# Patient Record
Sex: Male | Born: 1945 | Race: White | Hispanic: No | Marital: Married | Smoking: Former smoker
Health system: Southern US, Community
[De-identification: ages and names within clinical notes are randomized; demographics above are authoritative.]

## PROBLEM LIST (undated history)

## (undated) DIAGNOSIS — E039 Hypothyroidism, unspecified: Secondary | ICD-10-CM

## (undated) DIAGNOSIS — N529 Male erectile dysfunction, unspecified: Secondary | ICD-10-CM

## (undated) DIAGNOSIS — E291 Testicular hypofunction: Secondary | ICD-10-CM

## (undated) DIAGNOSIS — F329 Major depressive disorder, single episode, unspecified: Secondary | ICD-10-CM

## (undated) DIAGNOSIS — K219 Gastro-esophageal reflux disease without esophagitis: Secondary | ICD-10-CM

## (undated) HISTORY — DX: Testicular hypofunction: E29.1

## (undated) HISTORY — DX: Gastro-esophageal reflux disease without esophagitis: K21.9

## (undated) HISTORY — DX: Major depressive disorder, single episode, unspecified: F32.9

## (undated) HISTORY — DX: Hypothyroidism, unspecified: E03.9

## (undated) HISTORY — DX: Male erectile dysfunction, unspecified: N52.9

---

## 2001-01-09 ENCOUNTER — Encounter (INDEPENDENT_AMBULATORY_CARE_PROVIDER_SITE_OTHER): Payer: Self-pay | Admitting: Specialist

## 2001-01-09 ENCOUNTER — Encounter: Payer: Self-pay | Admitting: Family Medicine

## 2001-01-09 ENCOUNTER — Other Ambulatory Visit: Admission: RE | Admit: 2001-01-09 | Discharge: 2001-01-09 | Payer: Self-pay | Admitting: Gastroenterology

## 2004-07-09 ENCOUNTER — Emergency Department (HOSPITAL_COMMUNITY): Admission: EM | Admit: 2004-07-09 | Discharge: 2004-07-09 | Payer: Self-pay | Admitting: Emergency Medicine

## 2005-07-03 ENCOUNTER — Ambulatory Visit: Payer: Self-pay | Admitting: Family Medicine

## 2005-08-03 ENCOUNTER — Encounter: Admission: RE | Admit: 2005-08-03 | Discharge: 2005-08-03 | Payer: Self-pay | Admitting: Surgery

## 2005-09-18 ENCOUNTER — Ambulatory Visit: Payer: Self-pay | Admitting: Family Medicine

## 2005-09-25 ENCOUNTER — Ambulatory Visit: Payer: Self-pay | Admitting: Family Medicine

## 2007-01-09 ENCOUNTER — Ambulatory Visit: Payer: Self-pay | Admitting: Family Medicine

## 2007-01-09 LAB — CONVERTED CEMR LAB
Alkaline Phosphatase: 54 units/L (ref 39–117)
Basophils Relative: 0.7 % (ref 0.0–1.0)
Bilirubin, Direct: 0.1 mg/dL (ref 0.0–0.3)
Creatinine, Ser: 0.9 mg/dL (ref 0.4–1.5)
Eosinophils Relative: 3.3 % (ref 0.0–5.0)
GFR calc Af Amer: 110 mL/min
Glucose, Bld: 100 mg/dL — ABNORMAL HIGH (ref 70–99)
HDL: 52.4 mg/dL (ref >39.0)
Hgb A1c MFr Bld: 5.3 % (ref 4.6–6.0)
Lymphocytes Relative: 24 % (ref 12.0–46.0)
MCHC: 34.6 g/dL (ref 30.0–36.0)
MCV: 92.8 fL (ref 78.0–100.0)
Monocytes Relative: 10.8 % (ref 3.0–11.0)
PSA: 0.43 ng/mL (ref 0.10–4.00)
Platelets: 264 10*3/uL (ref 150–400)
RDW: 12.4 % (ref 11.5–14.6)
Total Bilirubin: 0.8 mg/dL (ref 0.3–1.2)
Total CHOL/HDL Ratio: 4.2
Total Protein: 7.1 g/dL (ref 6.0–8.3)
VLDL: 19 mg/dL (ref 0–40)

## 2007-01-16 ENCOUNTER — Ambulatory Visit: Payer: Self-pay | Admitting: Family Medicine

## 2007-09-01 IMAGING — CT CT NECK W/ CM
1 of 3 series · 8 of 14 positions shown, 10 images · IV contrast (75ML OMNI 300)
Comparison: none

CLINICAL DATA: Question mass or enlarged lymph node, left lower anterior neck.   Some neck soreness.
 CT NECK WITH CONTRAST:
TECHNIQUE: Multidetector CT imaging of the neck was performed following the standard protocol during administration of intravenous contrast.
 Contrast:  75 cc Omnipaque 300

[Series 2: neck w/ · axial · 0.39mm/px · z∈[-242,-32]mm · 8 of 110 slices shown, 10 images]
[im 13/110  soft-tissue]
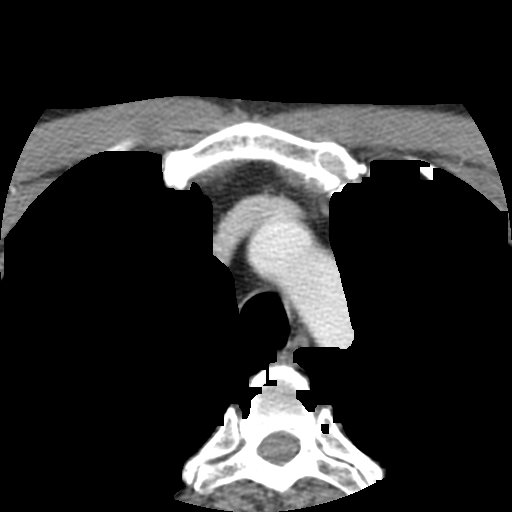
[im 13/110  bone]
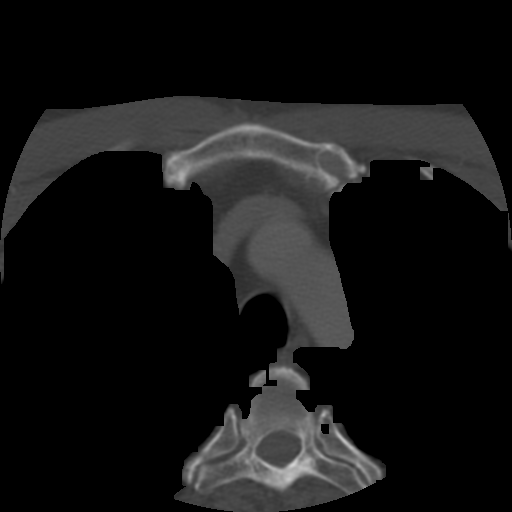
[im 25/110  bone]
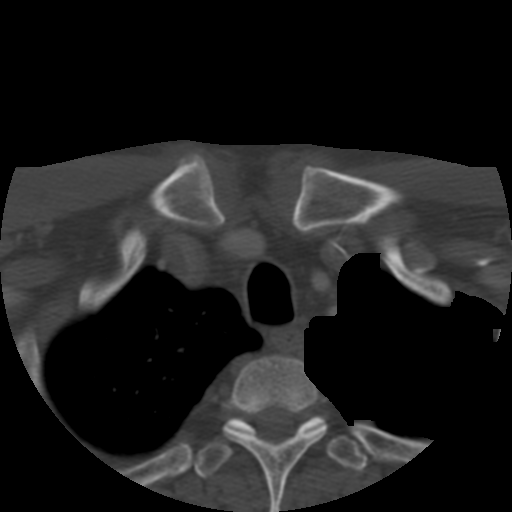
[im 37/110  bone]
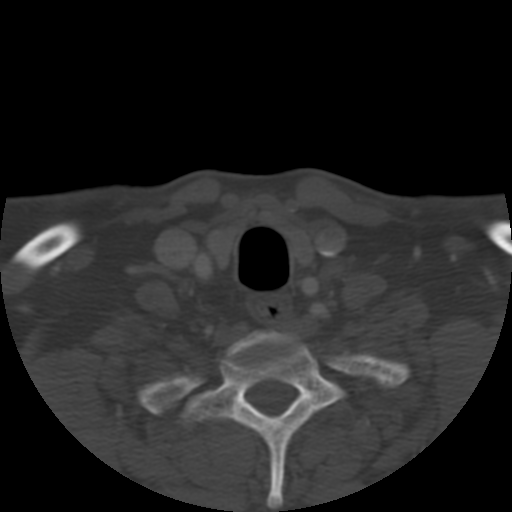
[im 49/110  bone]
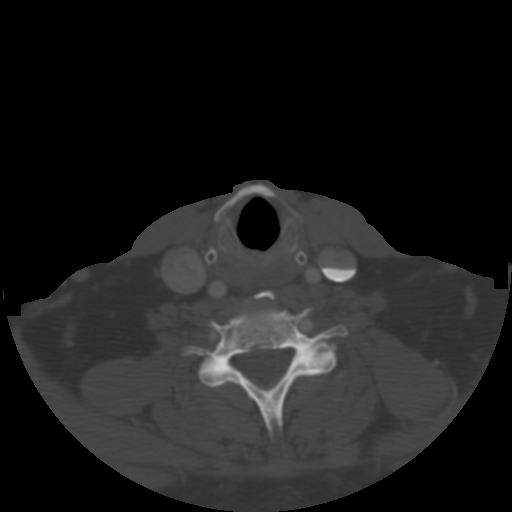
[im 61/110  soft-tissue]
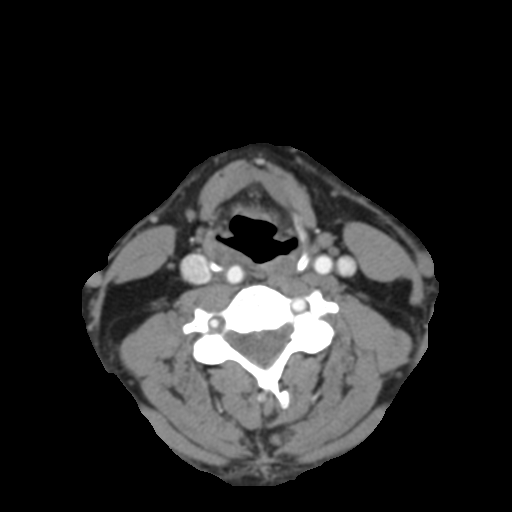
[im 61/110  bone]
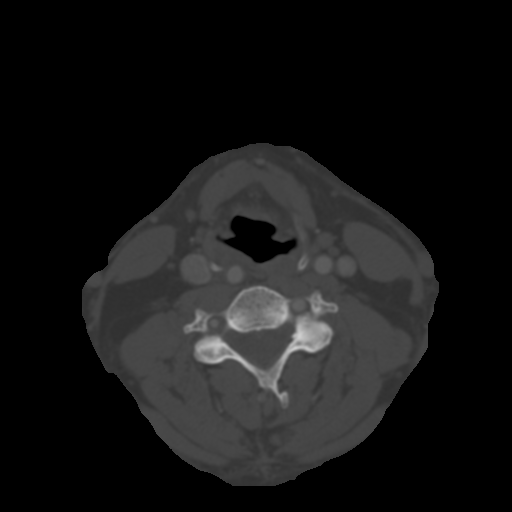
[im 73/110  bone]
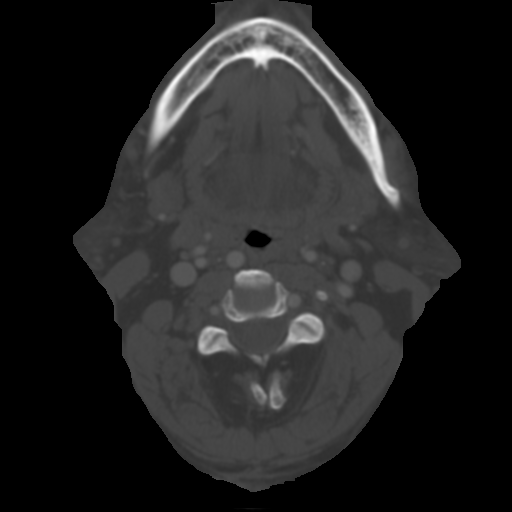
[im 85/110  bone]
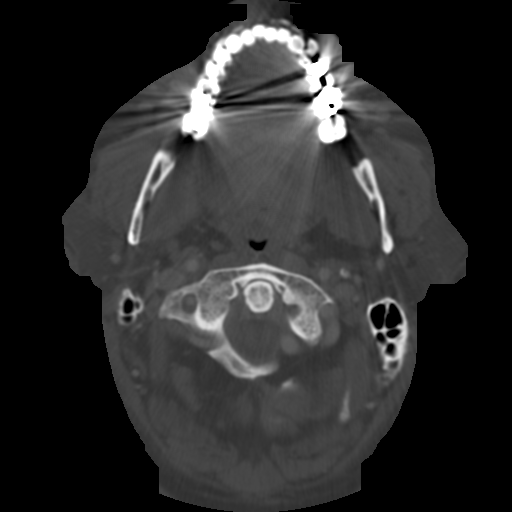
[im 97/110  bone]
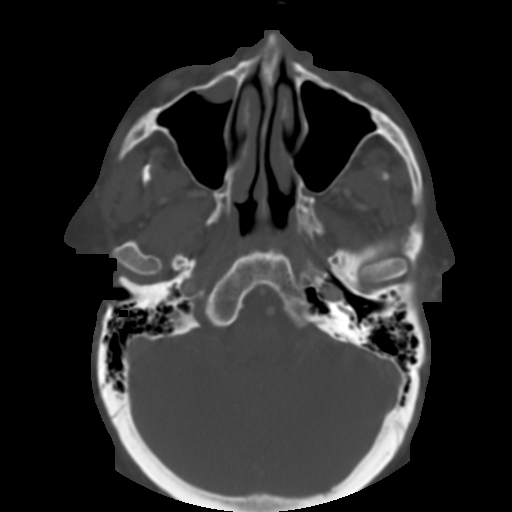

[8 of 14 positions shown; findings below may reference images not displayed]

FINDINGS: The paranasal sinuses that are visualized are clear.  The posterior oro-nasopharynx appears normal.  The vallecula, piriform sinuses and aryepiglottic folds appear normal as does the level of the true cords.  The subglottic airway appears normal.  The thyroid gland is normal in size.  The small metallic marker at the site questioned clinically overlies the lateral aspect of the lower left sternocleidomastoid muscle.  No underlying mass is seen.  No supraclavicular adenopathy is noted.  The lung apices appear clear.   The submandibular glands appear symmetrical and normal as do the parotid glands.
IMPRESSION: No mass or adenopathy.  The area questioned clinically overlies the left sternocleidomastoid muscle with no underlying mass evident.

## 2008-01-20 ENCOUNTER — Telehealth: Payer: Self-pay | Admitting: Family Medicine

## 2008-02-10 ENCOUNTER — Telehealth: Payer: Self-pay | Admitting: Family Medicine

## 2008-03-30 ENCOUNTER — Ambulatory Visit: Payer: Self-pay | Admitting: Family Medicine

## 2008-03-30 LAB — CONVERTED CEMR LAB
AST: 18 units/L (ref 0–37)
Alkaline Phosphatase: 50 units/L (ref 39–117)
Bilirubin, Direct: 0.1 mg/dL (ref 0.0–0.3)
CO2: 29 meq/L (ref 19–32)
Direct LDL: 133.6 mg/dL
Eosinophils Absolute: 0.2 10*3/uL (ref 0.0–0.7)
Eosinophils Relative: 4.1 % (ref 0.0–5.0)
GFR calc non Af Amer: 80 mL/min
Hemoglobin: 15.3 g/dL (ref 13.0–17.0)
Ketones, urine, test strip: NEGATIVE
Lymphocytes Relative: 24.6 % (ref 12.0–46.0)
Monocytes Relative: 9 % (ref 3.0–12.0)
Neutrophils Relative %: 61.8 % (ref 43.0–77.0)
Nitrite: NEGATIVE
PSA: 0.39 ng/mL (ref 0.10–4.00)
RBC: 4.79 M/uL (ref 4.22–5.81)
Sodium: 142 meq/L (ref 135–145)
TSH: 1.11 microintl units/mL (ref 0.35–5.50)
Total Protein: 6.6 g/dL (ref 6.0–8.3)
Triglycerides: 96 mg/dL (ref 0–149)
Urobilinogen, UA: 0.2
VLDL: 19 mg/dL (ref 0–40)

## 2008-04-06 ENCOUNTER — Ambulatory Visit: Payer: Self-pay | Admitting: Family Medicine

## 2008-04-07 DIAGNOSIS — F3289 Other specified depressive episodes: Secondary | ICD-10-CM

## 2008-04-07 DIAGNOSIS — F329 Major depressive disorder, single episode, unspecified: Secondary | ICD-10-CM

## 2008-04-07 DIAGNOSIS — E039 Hypothyroidism, unspecified: Secondary | ICD-10-CM | POA: Insufficient documentation

## 2008-04-07 DIAGNOSIS — K219 Gastro-esophageal reflux disease without esophagitis: Secondary | ICD-10-CM

## 2008-04-07 HISTORY — DX: Gastro-esophageal reflux disease without esophagitis: K21.9

## 2008-04-07 HISTORY — DX: Major depressive disorder, single episode, unspecified: F32.9

## 2008-04-07 HISTORY — DX: Other specified depressive episodes: F32.89

## 2008-04-07 HISTORY — DX: Hypothyroidism, unspecified: E03.9

## 2008-04-23 ENCOUNTER — Telehealth: Payer: Self-pay | Admitting: *Deleted

## 2008-06-11 ENCOUNTER — Telehealth: Payer: Self-pay | Admitting: Family Medicine

## 2008-07-15 ENCOUNTER — Telehealth: Payer: Self-pay | Admitting: Family Medicine

## 2009-05-19 ENCOUNTER — Telehealth: Payer: Self-pay | Admitting: Family Medicine

## 2009-12-10 ENCOUNTER — Ambulatory Visit: Payer: Self-pay | Admitting: Family Medicine

## 2009-12-10 DIAGNOSIS — N529 Male erectile dysfunction, unspecified: Secondary | ICD-10-CM

## 2009-12-10 HISTORY — DX: Male erectile dysfunction, unspecified: N52.9

## 2009-12-10 LAB — CONVERTED CEMR LAB
ALT: 19 units/L (ref 0–53)
Alkaline Phosphatase: 55 units/L (ref 39–117)
BUN: 17 mg/dL (ref 6–23)
Basophils Absolute: 0 10*3/uL (ref 0.0–0.1)
Bilirubin, Direct: <0.1 mg/dL (ref 0.0–0.3)
CO2: 18 meq/L — ABNORMAL LOW (ref 19–32)
Calcium: 9.9 mg/dL (ref 8.4–10.5)
Creatinine, Ser: 0.97 mg/dL (ref 0.40–1.50)
Eosinophils Absolute: 0.2 10*3/uL (ref 0.0–0.7)
HDL: 57 mg/dL (ref >39)
LDL Cholesterol: 123 mg/dL — ABNORMAL HIGH (ref 0–99)
Lymphocytes Relative: 20.2 % (ref 12.0–46.0)
Lymphs Abs: 1.2 10*3/uL (ref 0.7–4.0)
Monocytes Absolute: 0.4 10*3/uL (ref 0.1–1.0)
Monocytes Relative: 7.2 % (ref 3.0–12.0)
Neutro Abs: 4.3 10*3/uL (ref 1.4–7.7)
Neutrophils Relative %: 68.3 % (ref 43.0–77.0)
Potassium: 5.1 meq/L (ref 3.5–5.3)
RBC: 4.73 M/uL (ref 4.22–5.81)
Testosterone: 232.06 ng/dL — ABNORMAL LOW (ref 350.00–890.00)

## 2010-01-13 ENCOUNTER — Ambulatory Visit: Payer: Self-pay | Admitting: Family Medicine

## 2010-03-11 ENCOUNTER — Telehealth: Payer: Self-pay | Admitting: Family Medicine

## 2010-03-28 ENCOUNTER — Telehealth: Payer: Self-pay | Admitting: Family Medicine

## 2010-06-06 ENCOUNTER — Telehealth (INDEPENDENT_AMBULATORY_CARE_PROVIDER_SITE_OTHER): Payer: Self-pay | Admitting: *Deleted

## 2010-06-13 ENCOUNTER — Encounter: Payer: Self-pay | Admitting: Family Medicine

## 2010-06-13 DIAGNOSIS — E291 Testicular hypofunction: Secondary | ICD-10-CM

## 2010-06-13 HISTORY — DX: Testicular hypofunction: E29.1

## 2010-06-16 ENCOUNTER — Encounter: Payer: Self-pay | Admitting: Family Medicine

## 2010-10-17 ENCOUNTER — Telehealth: Payer: Self-pay | Admitting: Family Medicine

## 2010-10-18 ENCOUNTER — Telehealth: Payer: Self-pay | Admitting: Family Medicine

## 2010-10-19 ENCOUNTER — Telehealth: Payer: Self-pay | Admitting: Family Medicine

## 2010-12-27 NOTE — Assessment & Plan Note (Signed)
Summary: 30 min ov/njr/pt rescd from bump//ccm   Vital Signs:  Patient profile:   65 year old male Height:      66.25 inches Weight:      204 pounds BMI:     32.80 Temp:     98.4 degrees F oral BP sitting:   120 / 78  (left arm) Cuff size:   regular  Vitals Entered By: Kern Reap CMA Duncan Dull) (January 13, 2010 11:27 AM)  Reason for Visit cpx  History of Present Illness: Jesse Mcmahon is a 65 year old male, retired, comes in today for evaluation of a few issues.  He has a history of reflux esophagitis, for which he takes Prilosec 20 mg daily.  Asymptomatic on medication.  He takes levothyroxine 88 micrograms daily for hypothyroidism.  TSH level normal.  Continue above dose.  He uses Levitra 20 mg p.r.n., however, it doesn't seem to be helping much.  Testosterone level was drawn.  This low at 232.  We will discuss options.  He has extremely light-skinned, light.  Eyes and spent a lot of time in the sun and continues to do so.  He has a house at [place].  Advised to wear sunscreens.  I have into see a dermatologist because of the changes on his hands.  They appear to be actinic keratoses.  He gets routine eye care, but no dental care.  Recommended Dr. Alvester Morin.  Colonoscopy done, and GI.  Initially, a polyp.  Follow-up was normal.  Last tetanus booster, Pneumovax unknown.  Discuss these shingles.  Patient will check with his insurance and then decide  Allergies (verified): No Known Drug Allergies  Past History:  Past medical, surgical, family and social histories (including risk factors) reviewed, and no changes noted (except as noted below).  Past Medical History: Reviewed history from 04/06/2008 and no changes required. Depression GERD Hypothyroidism ED sun damaged skin  Family History: Reviewed history from 04/06/2008 and no changes required.  father date 17, tumor unknown primarymother in good health no brothers or sisters.  Social History: Reviewed history from  04/06/2008 and no changes required. Occupation: Married Former Smoker Alcohol use-no Drug use-no Regular exercise-yes  Review of Systems      See HPI  Physical Exam  General:  Well-developed,well-nourished,in no acute distress; alert,appropriate and cooperative throughout examination Head:  Normocephalic and atraumatic without obvious abnormalities. No apparent alopecia or balding. Eyes:  No corneal or conjunctival inflammation noted. EOMI. Perrla. Funduscopic exam benign, without hemorrhages, exudates or papilledema. Vision grossly normal. Ears:  External ear exam shows no significant lesions or deformities.  Otoscopic examination reveals clear canals, tympanic membranes are intact bilaterally without bulging, retraction, inflammation or discharge. Hearing is grossly normal bilaterally. Nose:  External nasal examination shows no deformity or inflammation. Nasal mucosa are pink and moist without lesions or exudates. Mouth:  Oral mucosa and oropharynx without lesions or exudates.  Teeth in good repair. Neck:  No deformities, masses, or tenderness noted. Chest Wall:  No deformities, masses, tenderness or gynecomastia noted. Breasts:  No masses or gynecomastia noted Lungs:  Normal respiratory effort, chest expands symmetrically. Lungs are clear to auscultation, no crackles or wheezes. Heart:  Normal rate and regular rhythm. S1 and S2 normal without gallop, murmur, click, rub or other extra sounds. Abdomen:  Bowel sounds positive,abdomen soft and non-tender without masses, organomegaly or hernias noted. Rectal:  No external abnormalities noted. Normal sphincter tone. No rectal masses or tenderness. Genitalia:  Testes bilaterally descended without nodularity, tenderness or masses. No scrotal  masses or lesions. No penis lesions or urethral discharge. Prostate:  Prostate gland firm and smooth, no enlargement, nodularity, tenderness, mass, asymmetry or induration. Msk:  No deformity or scoliosis  noted of thoracic or lumbar spine.   Pulses:  R and L carotid,radial,femoral,dorsalis pedis and posterior tibial pulses are full and equal bilaterally Extremities:  No clubbing, cyanosis, edema, or deformity noted with normal full range of motion of all joints.   Neurologic:  No cranial nerve deficits noted. Station and gait are normal. Plantar reflexes are down-going bilaterally. DTRs are symmetrical throughout. Sensory, motor and coordinative functions appear intact. Skin:  total body skin exam normal, except he has evidence of chronic sun damage.  Advised dermatology consult Cervical Nodes:  No lymphadenopathy noted Axillary Nodes:  No palpable lymphadenopathy Inguinal Nodes:  No significant adenopathy Psych:  Cognition and judgment appear intact. Alert and cooperative with normal attention span and concentration. No apparent delusions, illusions, hallucinations   Impression & Recommendations:  Problem # 1:  ERECTILE DYSFUNCTION, ORGANIC (ICD-607.84) Assessment Deteriorated  His updated medication list for this problem includes:    Levitra 20 Mg Tabs (Vardenafil hcl) ..... Uad  Orders: Prescription Created Electronically [phone])  Problem # 2:  HYPOTHYROIDISM (ICD-244.9) Assessment: Improved  His updated medication list for this problem includes:    Levothyroxine Sodium 88 Mcg Tabs (Levothyroxine sodium) ..... One by mouth daily  Orders: Prescription Created Electronically [phone])  Problem # 3:  GERD (ICD-530.81) Assessment: Improved  His updated medication list for this problem includes:    Prilosec 20 Mg Cpdr (Omeprazole) ..... Once daily  Orders: Prescription Created Electronically [phone])  Problem # 4:  PHYSICAL EXAMINATION (ICD-V70.0) Assessment: Unchanged  Complete Medication List: 1)  Prilosec 20 Mg Cpdr (Omeprazole) .... Once daily 2)  Levothyroxine Sodium 88 Mcg Tabs (Levothyroxine sodium) .... One by mouth daily 3)  Levitra 20 Mg Tabs (Vardenafil hcl) ....  Uad  Patient Instructions: 1)  It is important that you exercise regularly at least 20 minutes 5 times a week. If you develop chest pain, have severe difficulty breathing, or feel very tired , stop exercising immediately and seek medical attention. 2)  Schedule a colonoscopy/sigmoidoscopy to help detect colon cancer. 3)  Take an Aspirin every day. 4)   try Levitra for week or two.  If you would like to try the testosterone call and we can do this by phone 5)  Please schedule a follow-up appointment in 1 year. Prescriptions: LEVITRA 20 MG TABS (VARDENAFIL HCL) UAD  #6 x 11   Entered and Authorized by:   Roderick Pee MD   Signed by:   Roderick Pee MD on 01/13/2010   Method used:   Electronically to        [facility] (retail)       [address]       [place], [place]  [address]       Ph: [phone]       Fax: [phone]   RxID:   [id number] LEVOTHYROXINE SODIUM 88 MCG  TABS (LEVOTHYROXINE SODIUM) one by mouth daily  #100 x 3   Entered and Authorized by:   Roderick Pee MD   Signed by:   Roderick Pee MD on 01/13/2010   Method used:   Electronically to        [facility] (retail)       [address]  [place], [place]  [address]       Ph: [phone]       Fax: [phone]   RxID:   [id number] PRILOSEC 20 MG  CPDR (OMEPRAZOLE) once daily  #100 x 3   Entered and Authorized by:   Roderick Pee MD   Signed by:   Roderick Pee MD on 01/13/2010   Method used:   Electronically to        [facility] (retail)       [address]       [place], [place]  [address]       Ph: [phone]       Fax: [phone]   RxID:   [id number]     Appended Document: 30 min ov/njr/pt rescd from bump//ccm     Allergies: No Known Drug Allergies   Complete Medication List: 1)  Prilosec 20 Mg Cpdr (Omeprazole) .... Once daily 2)  Levothyroxine Sodium 88 Mcg Tabs  (Levothyroxine sodium) .... One by mouth daily 3)  Levitra 20 Mg Tabs (Vardenafil hcl) .... Uad  Other Orders: EKG w/ Interpretation (93000) TD Toxoids IM 7 YR + (88416) Pneumococcal Vaccine (60630) Admin 1st Vaccine (16010) Admin of Any Addtl Vaccine (93235)    Immunizations Administered:  Tetanus Vaccine:    Vaccine Type: Td    Site: right deltoid    Mfr: Sanofi Pasteur    Dose: 0.5 ml    Route: IM    Given by: Kern Reap CMA (AAMA)    Exp. Date: 10/12/2011    Lot #: [id number]    Physician counseled: yes  Pneumonia Vaccine:    Vaccine Type: Pneumovax    Site: left deltoid    Mfr: Merck    Dose: 0.5 ml    Route: IM    Given by: Kern Reap CMA (AAMA)    Exp. Date: 03/17/2011    Lot #: [id number]    Physician counseled: yes

## 2010-12-27 NOTE — Miscellaneous (Signed)
  Clinical Lists Changes  Problems: Added new problem of TESTICULAR HYPOFUNCTION (ICD-257.2)

## 2010-12-27 NOTE — Progress Notes (Signed)
Summary: please advise  Phone Note Call from Patient   Caller: Patient Call For: Roderick Pee MD Summary of Call: pt stated androderm 2.5mg  is no longer avail in patch. Pt does not want to go to gel  Please advise Initial call taken by: Heron Sabins,  October 18, 2010 11:34 AM  Follow-up for Phone Call        forplease have him call his insurance company to find out number one want to cover.  Number two if we can write it or if he has to go see a urologist Follow-up by: Roderick Pee MD,  October 18, 2010 1:54 PM  Additional Follow-up for Phone Call Additional follow up Details #1::        patient would like to know if he could try the androderm 5 HR  patch? he would like to stay on the patch if possible.   Additional Follow-up by: Kern Reap CMA Duncan Dull),  October 18, 2010 3:55 PM    Additional Follow-up for Phone Call Additional follow up Details #2::    Androderm 5 mg patches dispensed 30, directions one daily refill x 6 Follow-up by: Roderick Pee MD,  October 18, 2010 6:04 PM  Additional Follow-up for Phone Call Additional follow up Details #3:: Details for Additional Follow-up Action Taken: Concerned about why he hasn't received his patches.  Advise him Dr. Tawanna Cooler authorized them, and  I woulld check to see if Fleet Contras had called them in. Additional Follow-up by: Lynann Beaver CMA AAMA,  October 19, 2010 8:53 AM

## 2010-12-27 NOTE — Medication Information (Signed)
Summary: Androderm Approved  Androderm Approved   Imported By: Maryln Gottron 06/17/2010 10:16:23  _____________________________________________________________________  External Attachment:    Type:   Image     Comment:   External Document

## 2010-12-27 NOTE — Progress Notes (Signed)
Summary: androderm patch  Phone Note Call from Patient   Summary of Call: patient called back to let us know that the androderm patch will be changed on november 1 from 2.5 - 2, or 5.0 - 4. Initial call taken by: Kern Reap CMA Duncan Dull),  October 19, 2010 11:41 AM  Follow-up for Phone Call        new rx for 4 has been called into the pharmary. Follow-up by: Kern Reap CMA Duncan Dull),  October 19, 2010 11:47 AM

## 2010-12-27 NOTE — Progress Notes (Signed)
Summary: testerone PA  Phone Note Call from Patient Call back at Work Phone [phone]   Caller: vm Summary of Call: Testerone patch.  Couple weeks ago [employer] said they forwarded request for PA.  Is hold up at your office?  Please call. Initial call taken by: Rudy Jew, RN,  June 06, 2010 10:56 AM  Follow-up for Phone Call        do not have it. Follow-up by: Warnell Forester,  June 06, 2010 11:12 AM  Additional Follow-up for Phone Call Additional follow up Details #1::        Left message at pharmacy to refax PA. Additional Follow-up by: Lynann Beaver CMA,  June 06, 2010 11:14 AM

## 2010-12-27 NOTE — Progress Notes (Signed)
Summary: androderm refill  Phone Note Refill Request Message from:  Fax from Pharmacy on October 17, 2010 11:52 AM  Refills Requested: Medication #1:  ANDRODERM 2.5 MG/24HR PT24 Apply one patch daily. Initial call taken by: Kern Reap CMA Duncan Dull),  October 17, 2010 11:52 AM    Prescriptions: Cathren Laine 2.5 MG/24HR PT24 (TESTOSTERONE) Apply one patch daily  #60 x 5   Entered by:   Kern Reap CMA (AAMA)   Authorized by:   Roderick Pee MD   Signed by:   Kern Reap CMA (AAMA) on 10/17/2010   Method used:   Telephoned to ...       [facility]       [address]       [place], [place]  [address]       Ph: [phone]       Fax: [phone]   RxID:   [id number]

## 2010-12-27 NOTE — Progress Notes (Signed)
Summary: Pt checking on status of refill of Celexa  Phone Note Call from Patient Call back at [facility] Phone [phone]   Caller: Patient Summary of Call: Pt called re: status of Celexa. Pt says [facility] on [address], never recv refill. Please call asap.  Initial call taken by: Lucy Antigua,  March 11, 2010 8:47 AM  Follow-up for Phone Call        I called pt and notified them that script has been electronically sent to [facility] on [facility]. Follow-up by: Lucy Antigua,  March 11, 2010 10:16 AM    New/Updated Medications: CELEXA 20 MG TABS (CITALOPRAM HYDROBROMIDE) take one tab by mouth at bedtime Prescriptions: CELEXA 20 MG TABS (CITALOPRAM HYDROBROMIDE) take one tab by mouth at bedtime  #100 x 3   Entered by:   Kern Reap CMA (AAMA)   Authorized by:   Roderick Pee MD   Signed by:   Kern Reap CMA (AAMA) on 03/11/2010   Method used:   Electronically to        [facility] (retail)       [address]       [place], [place]  [address]       Ph: [phone]       Fax: [phone]   RxID:   [id number]

## 2010-12-27 NOTE — Progress Notes (Signed)
Summary: testosterone  Phone Note Call from Patient   Caller: Patient Call For: Roderick Pee MD Summary of Call: Pt states Dr. Tawanna Cooler told him he needed supplemental testosterone, and he has decided to do this.  Please call [facility]) [phone] Initial call taken by: Lynann Beaver CMA,  Mar 28, 2010 2:45 PM  Follow-up for Phone Call        androderm 2.5 mg  patches dispensed 30, patches directions apply one daily refills x 5.  Return in two months for follow-up Follow-up by: Roderick Pee MD,  Mar 28, 2010 3:02 PM    New/Updated Medications: ANDRODERM 2.5 MG/24HR PT24 (TESTOSTERONE) Apply one patch daily Prescriptions: ANDRODERM 2.5 MG/24HR PT24 (TESTOSTERONE) Apply one patch daily  #30 x 5   Entered by:   Lynann Beaver CMA   Authorized by:   Roderick Pee MD   Signed by:   Lynann Beaver CMA on 03/28/2010   Method used:   Telephoned to ...       [facility] ([facility]       [address]       [place], [place]  [address]       Ph: [phone]       Fax: [phone]   RxID:   [id number]  Pt. notified.

## 2010-12-27 NOTE — Assessment & Plan Note (Signed)
Summary: fup on meds//ccm   Vital Signs:  Patient profile:   65 year old male Weight:      202 pounds Temp:     98.4 degrees F oral BP sitting:   138 / 88  Vitals Entered By: Lynann Beaver CMA (December 10, 2009 10:18 AM) CC: rov to refil meds Is Patient Diabetic? No Pain Assessment Patient in pain? no        CC:  rov to refil meds.  History of Present Illness: Jesse Mcmahon is a 65 year old male, married, nonsmoker, who comes in today for medication evaluation of depression, reflux esophagitis, hypothyroidism, and erectile dysfunction.  His last physical exam was May 2009.  I explained to him that he needs to come yearly.  We cannot renew his medicines in the future unless he comes yearly.  He stopped the Celexa two weeks ago.  Feels well.  Would like to stay off of it.  He takes Prilosec 20 mg OTC daily for reflux esophagitis and asymptomatic on medication.  He takes Synthroid 88 micrograms daily needs thyroid level checked.  HI Cialis and Viagra.  They don't seem to help.  Will switch to Levitra and check a testosterone level.  He does not get routine eye exams recommended.  Dr. Mia Creek.  He does not get routine dental care.  Recommended Dr. Alvester Morin, D.D.S.  He said colonoscopy x 2 in GI.  The first time they found a polyp.  This follow up exam in two years, which was normal.  He declines a flu shot  Current Medications (verified): 1)  Celexa 20 Mg  Tabs (Citalopram Hydrobromide) .... At Bedtime 2)  Prilosec 20 Mg  Cpdr (Omeprazole) .... Once Daily 3)  Levothyroxine Sodium 88 Mcg  Tabs (Levothyroxine Sodium) .... One By Mouth Daily 4)  Cialis 20 Mg  Tabs (Tadalafil) .... Use As Directed  Allergies (verified): No Known Drug Allergies  Past History:  Past medical, surgical, family and social histories (including risk factors) reviewed for relevance to current acute and chronic problems.  Past Medical History: Reviewed history from 04/06/2008 and no changes  required. Depression GERD Hypothyroidism ED sun damaged skin  Family History: Reviewed history from 04/06/2008 and no changes required.  father date 91, tumor unknown primarymother in good health no brothers or sisters.  Social History: Reviewed history from 04/06/2008 and no changes required. Occupation: Married Former Smoker Alcohol use-no Drug use-no Regular exercise-yes  Review of Systems      See HPI  Physical Exam  General:  Well-developed,well-nourished,in no acute distress; alert,appropriate and cooperative throughout examination   Impression & Recommendations:  Problem # 1:  HYPOTHYROIDISM (ICD-244.9) Assessment Improved  His updated medication list for this problem includes:    Levothyroxine Sodium 88 Mcg Tabs (Levothyroxine sodium) ..... One by mouth daily  Orders: Venipuncture ([address]) TLB-Lipid Panel (80061-LIPID) TLB-BMP (Basic Metabolic Panel-BMET) (80048-METABOL) TLB-CBC Platelet - w/Differential (85025-CBCD) TLB-Hepatic/Liver Function Pnl (80076-HEPATIC) TLB-TSH (Thyroid Stimulating Hormone) (84443-TSH) TLB-PSA (Prostate Specific Antigen) (84153-PSA) TLB-Testosterone, Total (84403-TESTO) Prescription Created Electronically [phone]) UA Dipstick w/o Micro (automated)  (81003)  Problem # 2:  GERD (ICD-530.81) Assessment: Improved  His updated medication list for this problem includes:    Prilosec 20 Mg Cpdr (Omeprazole) ..... Once daily  Orders: Venipuncture ([address]) TLB-Lipid Panel (80061-LIPID) TLB-BMP (Basic Metabolic Panel-BMET) (80048-METABOL) TLB-CBC Platelet - w/Differential (85025-CBCD) TLB-Hepatic/Liver Function Pnl (80076-HEPATIC) TLB-TSH (Thyroid Stimulating Hormone) (84443-TSH) TLB-PSA (Prostate Specific Antigen) (84153-PSA) TLB-Testosterone, Total (84403-TESTO) Prescription Created Electronically [id number]) UA Dipstick w/o Micro (automated)  (81003)  Problem #  3:  DEPRESSION (ICD-311) Assessment: Improved  The following  medications were removed from the medication list:    Celexa 20 Mg Tabs (Citalopram hydrobromide) [facility]... At bedtime  Problem # 4:  ERECTILE DYSFUNCTION, ORGANIC (ICD-607.84) Assessment: Deteriorated  The following medications were removed from the medication list:    Cialis 20 Mg Tabs (Tadalafil) ..... Use as directed His updated medication list for this problem includes:    Levitra 20 Mg Tabs (Vardenafil hcl) ..... Uad  Orders: Venipuncture (98119) TLB-Lipid Panel (80061-LIPID) TLB-BMP (Basic Metabolic Panel-BMET) (80048-METABOL) TLB-CBC Platelet - w/Differential (85025-CBCD) TLB-Hepatic/Liver Function Pnl (80076-HEPATIC) TLB-TSH (Thyroid Stimulating Hormone) (84443-TSH) TLB-PSA (Prostate Specific Antigen) (84153-PSA) TLB-Testosterone, Total (84403-TESTO) Prescription Created Electronically [phone]) UA Dipstick w/o Micro (automated)  (81003)  Complete Medication List: 1)  Prilosec 20 Mg Cpdr (Omeprazole) .... Once daily 2)  Levothyroxine Sodium 88 Mcg Tabs (Levothyroxine sodium) .... One by mouth daily 3)  Levitra 20 Mg Tabs (Vardenafil hcl) .... Uad  Patient Instructions: 1)  continue the Prilosec and your thyroid medication.  Let's try some Levitra to see if that does not work better. 2)  We will get your lab work today. 3)  Set up a 30 minute appointment sometime in the next 4 to 6 weeks. 4)  Dr. Gweneth Dimitri, ophthalmologist, Dr. Alvester Morin, D.D.S. dentist Prescriptions: LEVITRA 20 MG TABS (VARDENAFIL HCL) UAD  #6 x 11   Entered and Authorized by:   Roderick Pee MD   Signed by:   Roderick Pee MD on 12/10/2009   Method used:   Electronically to        [facility] (retail)       [address]       [place], [place]  [address]       Ph: [phone]       Fax: [phone]   RxID:   [id number] LEVOTHYROXINE SODIUM 88 MCG  TABS (LEVOTHYROXINE SODIUM) one by mouth daily  #100 Each x 3   Entered and Authorized by:   Roderick Pee MD   Signed  by:   Roderick Pee MD on 12/10/2009   Method used:   Electronically to        [facility] (retail)       [address]       [place], [place]  [address]       Ph: [phone]       Fax: [phone]   RxID:   [id number] PRILOSEC 20 MG  CPDR (OMEPRAZOLE) once daily  #100 x 3   Entered and Authorized by:   Roderick Pee MD   Signed by:   Roderick Pee MD on 12/10/2009   Method used:   Electronically to        [facility] (retail)       [address]       [place], [place]  [address]       Ph: [phone]       Fax: [phone]   RxID:   [id number]   Appended Document: fup on meds//ccm  Laboratory Results   Urine Tests    Routine Urinalysis   Color: yellow Appearance: Clear Glucose: negative   (Normal Range: Negative) Bilirubin: negative   (Normal Range: Negative) Ketone: negative   (Normal Range: Negative) Spec. Gravity: 1.020   (Normal  Range: 1.003-1.035) Blood: negative   (Normal Range: Negative) pH: 5.5   (Normal Range: 5.0-8.0) Protein: negative   (Normal Range: Negative) Urobilinogen: 0.2   (Normal Range: 0-1) Nitrite: negative   (Normal Range: Negative) Leukocyte Esterace: negative   (Normal Range: Negative)    Comments: Joanne Chars CMA  December 10, 2009 4:19 PM      Appended Document: Orders Update    Clinical Lists Changes  Orders: Added new Test order of T- * Misc. Laboratory test [phone]) - Signed

## 2011-02-17 ENCOUNTER — Other Ambulatory Visit: Payer: Self-pay | Admitting: *Deleted

## 2011-02-17 MED ORDER — LEVOTHYROXINE SODIUM 88 MCG PO TABS: 88.0000 ug | ORAL_TABLET | Freq: Every day | ORAL | Status: DC

## 2011-03-01 ENCOUNTER — Other Ambulatory Visit: Payer: Self-pay | Admitting: Family Medicine

## 2011-03-02 ENCOUNTER — Other Ambulatory Visit: Payer: Self-pay | Admitting: *Deleted

## 2011-03-02 MED ORDER — CITALOPRAM HYDROBROMIDE 20 MG PO TABS: 20.0000 mg | ORAL_TABLET | Freq: Every day | ORAL | Status: DC

## 2011-03-16 ENCOUNTER — Telehealth: Payer: Self-pay | Admitting: Family Medicine

## 2011-03-16 NOTE — Telephone Encounter (Signed)
Pt called and said that Dr Tawanna Cooler agreed to a 90 day extension on Synthroid,Androderm,Omeprazole,Citalopram(Celexa),Levitra to [facility] on [address]. Pt said that he will sch ov with Dr Tawanna Cooler in late fall or early winter.

## 2011-03-17 ENCOUNTER — Other Ambulatory Visit: Payer: Self-pay | Admitting: *Deleted

## 2011-03-17 MED ORDER — OMEPRAZOLE 20 MG PO CPDR: 20.0000 mg | DELAYED_RELEASE_CAPSULE | Freq: Every day | ORAL | Status: DC

## 2011-03-17 MED ORDER — LEVOTHYROXINE SODIUM 88 MCG PO TABS: 88.0000 ug | ORAL_TABLET | Freq: Every day | ORAL | Status: DC

## 2011-03-17 MED ORDER — VARDENAFIL HCL 20 MG PO TABS: 20.0000 mg | ORAL_TABLET | ORAL | Status: DC | PRN

## 2011-03-17 MED ORDER — CITALOPRAM HYDROBROMIDE 20 MG PO TABS: 20.0000 mg | ORAL_TABLET | Freq: Every day | ORAL | Status: DC

## 2011-03-17 MED ORDER — TESTOSTERONE 2.5 MG/24HR TD PT24: 1.0000 | MEDICATED_PATCH | Freq: Every day | TRANSDERMAL | Status: DC

## 2011-04-14 NOTE — Consult Note (Signed)
NAME:  Jesse Mcmahon, Jesse Mcmahon                          ACCOUNT NO.:  [id number]   MEDICAL RECORD NO.:  [id number]                   PATIENT TYPE:  EMS   LOCATION:  ED                                   FACILITY:  [facility]   PHYSICIAN:  Vanita Panda. Magnus Ivan, M.D.       DATE OF BIRTH:  Sep 25, 1946   DATE OF CONSULTATION:  07/09/2004  DATE OF DISCHARGE:                                   CONSULTATION   CHIEF COMPLAINT:  Left index finger tip amputation.   HISTORY OF PRESENT ILLNESS:  Briefly, Mr. Sikorski is a 65 year old right-hand  dominant male who had his left index fingertip caught in a pulley-type of an  apparatus while he working on his boathouse.  He had a complete amputation  at the tip of his finger and went to a local fire department where the  finger was cleaned and he came to the [facility] Emergency Department for  further evaluation and treatment.  He did not bring the finger tip with him.  He denied any other injuries.  In the emergency room, the ER doctor did a  digital nerve block with lidocaine.   PAST MEDICAL HISTORY:  Significant for hypothyroidism.   MEDICATIONS:  Synthroid.   ALLERGIES:  1. SHELLFISH.  2. IV CONTRAST DYE.  3. IODINE.   SOCIAL HISTORY:  He works as a Secondary school teacher.  Lives in [place]  and denies any smoking.   PHYSICAL EXAMINATION:  VITAL SIGNS:  He is afebrile and his vital signs are  stable.  GENERAL:  This is an alert and oriented male in no obvious discomfort or  acute distress.  EXTREMITIES:  Examination of the left index finger shows a complete  amputation at the tip of the finger that is with complete loss of the  fingernail and the end of the finger.  There is exposed distal phalanx  present.  His extension and flexion of the DIP joint was completely intact.  He had good bleeding at the fingertip.  Sensation was not discernible  secondary to his nerve block that was already performed.  He had full range  of motion at the PIP and MCP  joint as well.   X-rays showed a comminuted fracture at the tip of the finger down to the  midportion of the distal phalanx.  The DIP joint was uninvolved.   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   PLAN:  In the emergency room, his finger was prepped with Betadine solution  and sterile dressings.  Using a __________, the fingertip was then cleansed  with 1 L of sterile saline and Betadine solution.  The exposed bone was cut  back to a portion where it would be nonexposed with a rongeur.  The sharp  edges were removed as well.  The pulp of the finger then was allowed to be  closed with interrupted 4-0 chromic sutures  followed by portions of the skin  with interrupted 4-0 Vicryl suture.  The end of the fingertip showed good  bleeding granulation tissue.  The finger was then cleansed and sterile  Xeroform followed by sterile dressing was applied and then a palmar splint  was applied as well to keep the finger in full extension.  The patient was  given instructions to return to the orthopedic clinic on Monday at [facility].  After that time, we will start wet-to-dry dressing changes  twice daily.  He was given a dose of IV Kefzol in the emergency room as well  as a tetanus vaccination.  He was discharged with oral pain medications as  well as Keflex to take four times a day.  Again, I will see him back on  Monday and at that time we will start wet-to-dry dressing changes to work  with granulation.  He was instructed to return to the emergency room if he  had signs or symptoms of pain or infection.  Otherwise, he can follow up in  the clinic on Monday.                                               Vanita Panda. Magnus Ivan, M.D.    CYB/MEDQ  D:  07/09/2004  T:  07/10/2004  Job:  [id number]

## 2011-04-30 ENCOUNTER — Other Ambulatory Visit: Payer: Self-pay | Admitting: Family Medicine

## 2011-05-05 ENCOUNTER — Other Ambulatory Visit: Payer: Self-pay | Admitting: Family Medicine

## 2011-09-04 ENCOUNTER — Telehealth: Payer: Self-pay | Admitting: Family Medicine

## 2011-09-04 MED ORDER — OMEPRAZOLE 20 MG PO CPDR: 20.0000 mg | DELAYED_RELEASE_CAPSULE | Freq: Every day | ORAL | Status: DC

## 2011-09-04 MED ORDER — VARDENAFIL HCL 20 MG PO TABS: 20.0000 mg | ORAL_TABLET | ORAL | Status: DC | PRN

## 2011-09-04 MED ORDER — CITALOPRAM HYDROBROMIDE 20 MG PO TABS: 20.0000 mg | ORAL_TABLET | Freq: Every day | ORAL | Status: DC

## 2011-09-04 MED ORDER — LEVOTHYROXINE SODIUM 88 MCG PO TABS: 88.0000 ug | ORAL_TABLET | Freq: Every day | ORAL | Status: DC

## 2011-09-04 NOTE — Telephone Encounter (Signed)
rx have been sent to pharmacy

## 2011-09-04 NOTE — Telephone Encounter (Signed)
Last seen 01/13/10. Pls advise.

## 2011-09-04 NOTE — Telephone Encounter (Signed)
Okay to refill medication until physical exam

## 2011-09-04 NOTE — Telephone Encounter (Signed)
Refill Celexa,levitra,levothyroxine, Omeprazole to [facility]. Cpx is in January. Thanks.

## 2011-11-24 ENCOUNTER — Encounter: Payer: Self-pay | Admitting: *Deleted

## 2011-11-24 ENCOUNTER — Encounter: Payer: Self-pay | Admitting: Family Medicine

## 2011-12-05 ENCOUNTER — Ambulatory Visit (INDEPENDENT_AMBULATORY_CARE_PROVIDER_SITE_OTHER): Payer: Medicare Other | Admitting: Family Medicine

## 2011-12-05 ENCOUNTER — Encounter: Payer: Self-pay | Admitting: Family Medicine

## 2011-12-05 DIAGNOSIS — E291 Testicular hypofunction: Secondary | ICD-10-CM

## 2011-12-05 DIAGNOSIS — Z23 Encounter for immunization: Secondary | ICD-10-CM

## 2011-12-05 DIAGNOSIS — E039 Hypothyroidism, unspecified: Secondary | ICD-10-CM

## 2011-12-05 DIAGNOSIS — K219 Gastro-esophageal reflux disease without esophagitis: Secondary | ICD-10-CM

## 2011-12-05 DIAGNOSIS — N401 Enlarged prostate with lower urinary tract symptoms: Secondary | ICD-10-CM

## 2011-12-05 DIAGNOSIS — Z13220 Encounter for screening for lipoid disorders: Secondary | ICD-10-CM

## 2011-12-05 DIAGNOSIS — Z0000 Encounter for general adult medical examination without abnormal findings: Secondary | ICD-10-CM

## 2011-12-05 DIAGNOSIS — F329 Major depressive disorder, single episode, unspecified: Secondary | ICD-10-CM

## 2011-12-05 DIAGNOSIS — N529 Male erectile dysfunction, unspecified: Secondary | ICD-10-CM

## 2011-12-05 DIAGNOSIS — Z136 Encounter for screening for cardiovascular disorders: Secondary | ICD-10-CM

## 2011-12-05 LAB — BASIC METABOLIC PANEL
BUN: 17 mg/dL (ref 6–23)
Creatinine, Ser: 0.9 mg/dL (ref 0.4–1.5)
Potassium: 4.7 mEq/L (ref 3.5–5.1)
Sodium: 138 mEq/L (ref 135–145)

## 2011-12-05 LAB — CBC WITH DIFFERENTIAL/PLATELET
Basophils Relative: 0.3 % (ref 0.0–3.0)
Eosinophils Relative: 3.1 % (ref 0.0–5.0)
HCT: 46.7 % (ref 39.0–52.0)
MCHC: 34.3 g/dL (ref 30.0–36.0)
Monocytes Absolute: 0.5 10*3/uL (ref 0.1–1.0)
Monocytes Relative: 8 % (ref 3.0–12.0)
Neutro Abs: 4.4 10*3/uL (ref 1.4–7.7)

## 2011-12-05 LAB — POCT URINALYSIS DIPSTICK
Blood, UA: NEGATIVE
Glucose, UA: NEGATIVE
Ketones, UA: NEGATIVE
Nitrite, UA: NEGATIVE
Urobilinogen, UA: 0.2
pH, UA: 5

## 2011-12-05 LAB — LIPID PANEL
HDL: 54.8 mg/dL (ref >39.00)
Triglycerides: 113 mg/dL (ref 0.0–149.0)

## 2011-12-05 LAB — HEPATIC FUNCTION PANEL
ALT: 29 U/L (ref 0–53)
AST: 20 U/L (ref 0–37)
Alkaline Phosphatase: 56 U/L (ref 39–117)
Bilirubin, Direct: 0.1 mg/dL (ref 0.0–0.3)

## 2011-12-05 LAB — PSA: PSA: 0.31 ng/mL (ref 0.10–4.00)

## 2011-12-05 LAB — TSH: TSH: 2.01 u[IU]/mL (ref 0.35–5.50)

## 2011-12-05 LAB — LDL CHOLESTEROL, DIRECT: Direct LDL: 149.4 mg/dL

## 2011-12-05 MED ORDER — CITALOPRAM HYDROBROMIDE 20 MG PO TABS: 20.0000 mg | ORAL_TABLET | Freq: Every day | ORAL | Status: DC

## 2011-12-05 MED ORDER — VARDENAFIL HCL 20 MG PO TABS: 20.0000 mg | ORAL_TABLET | ORAL | Status: DC | PRN

## 2011-12-05 MED ORDER — LEVOTHYROXINE SODIUM 88 MCG PO TABS: 88.0000 ug | ORAL_TABLET | Freq: Every day | ORAL | Status: DC

## 2011-12-05 MED ORDER — OMEPRAZOLE 20 MG PO CPDR: 20.0000 mg | DELAYED_RELEASE_CAPSULE | Freq: Every day | ORAL | Status: DC

## 2011-12-05 NOTE — Patient Instructions (Signed)
Continue your current medications.  I would recommend you come back in 6 months for a thorough skin exam only.  Return January 2014 ............. annual physical

## 2011-12-05 NOTE — Progress Notes (Signed)
  Subjective:    Patient ID: Jesse Mcmahon, male    DOB: May 12, 1946, 66 y.o.   MRN: [id number]  HPIDonald is a 66 year old, married male, nonsmoker, who comes in today for Medicare wellness examination because of a history of depression, hypothyroidism, reflux, esophagitis, erectile dysfunction, and testicular hypo-function.  His medications were reviewed in detail.  There been no changes except he is not taking the testosterone supplement because of cost.  He gets routine eye care, hearing normal, regular dental care.  No.......Marland Kitchen Referred to Dr. Alvester Morin....... Colonoscopy x 2, last one normal, tetanus, 2011, Pneumovax 2011, seasonal flu shot today, shingles.  Vaccine today.  Cognitive function normal.  He continues Enjoy life at the, [place] she's been retired now for 3 years and.  He walks on a regular basis.  Home health safety reviewed.  No issues identified.  No guns in the house.  He does have a healthcare power of attorney and living will.    Review of Systems  Constitutional: Negative.   HENT: Negative.   Eyes: Negative.   Respiratory: Negative.   Cardiovascular: Negative.   Gastrointestinal: Negative.   Genitourinary: Negative.   Musculoskeletal: Negative.   Skin: Negative.   Neurological: Negative.   Hematological: Negative.   Psychiatric/Behavioral: Negative.        Objective:   Physical Exam  Constitutional: He is oriented to person, place, and time. He appears well-developed and well-nourished.  HENT:  Head: Normocephalic and atraumatic.  Right Ear: External ear normal.  Left Ear: External ear normal.  Nose: Nose normal.  Mouth/Throat: Oropharynx is clear and moist.  Eyes: Conjunctivae and EOM are normal. Pupils are equal, round, and reactive to light.  Neck: Normal range of motion. Neck supple. No JVD present. No tracheal deviation present. No thyromegaly present.  Cardiovascular: Normal rate, regular rhythm, normal heart sounds and intact distal pulses.  Exam reveals  no gallop and no friction rub.   No murmur heard. Pulmonary/Chest: Effort normal and breath sounds normal. No stridor. No respiratory distress. He has no wheezes. He has no rales. He exhibits no tenderness.  Abdominal: Soft. Bowel sounds are normal. He exhibits no distension and no mass. There is no tenderness. There is no rebound and no guarding.  Genitourinary: Rectum normal and penis normal. Guaiac negative stool. No penile tenderness.       3+ symmetrical.  BPH  Musculoskeletal: Normal range of motion. He exhibits no edema and no tenderness.  Lymphadenopathy:    He has no cervical adenopathy.  Neurological: He is alert and oriented to person, place, and time. He has normal reflexes. No cranial nerve deficit. He exhibits normal muscle tone.  Skin: Skin is warm and dry. No rash noted. No erythema. No pallor.       Total body skin exam done.  He does have light skin and red hair and chronic sun damage.  The worst as to his arms and back and face.  A  Psychiatric: He has a normal mood and affect. His behavior is normal. Judgment and thought content normal.          Assessment & Plan:  Healthy male.  Mild depression continue Celexa 20 mg daily.  History of hypothyroidism.  Continue Synthroid 88 mcg daily check TSH level.  Reflux esophagitis.  Continue Prilosec 20 daily  Testicular hypofunction.  Continue Levitra p.r.n. Considers restarting the testosterone supplement.  Chronic sun damage.  Advise sunscreen and gloves.  A skin check every 6 months to

## 2012-01-23 ENCOUNTER — Telehealth: Payer: Self-pay | Admitting: *Deleted

## 2012-01-23 MED ORDER — CYCLOBENZAPRINE HCL 5 MG PO TABS: 5.0000 mg | ORAL_TABLET | Freq: Every evening | ORAL | Status: AC | PRN

## 2012-01-23 NOTE — Telephone Encounter (Signed)
rx sent to phamacy Left message on machine for patient

## 2012-01-23 NOTE — Telephone Encounter (Signed)
Flexeril 5 mg dispense 30 tabs directions 1 each bedtime when necessary for back pain 1 refill

## 2012-01-23 NOTE — Telephone Encounter (Signed)
Pt is asking for muscle relaxers for a pulled muscle in his back playing golf?

## 2012-11-29 ENCOUNTER — Telehealth: Payer: Self-pay | Admitting: Family Medicine

## 2012-11-29 DIAGNOSIS — K219 Gastro-esophageal reflux disease without esophagitis: Secondary | ICD-10-CM

## 2012-11-29 MED ORDER — OMEPRAZOLE 20 MG PO CPDR: 20.0000 mg | DELAYED_RELEASE_CAPSULE | Freq: Every day | ORAL | Status: DC

## 2012-11-29 NOTE — Telephone Encounter (Signed)
rx sent in electronically 

## 2012-11-29 NOTE — Telephone Encounter (Signed)
Patient is calling for a refill on his omeprazole to [facility] on [address], said he called his pharmacy also but they have not heard back from our office

## 2013-01-06 ENCOUNTER — Other Ambulatory Visit: Payer: Self-pay | Admitting: Family Medicine

## 2013-03-04 ENCOUNTER — Other Ambulatory Visit: Payer: Self-pay | Admitting: Family Medicine

## 2013-12-04 ENCOUNTER — Ambulatory Visit (INDEPENDENT_AMBULATORY_CARE_PROVIDER_SITE_OTHER): Payer: Medicare Other | Admitting: Family Medicine

## 2013-12-04 ENCOUNTER — Encounter: Payer: Self-pay | Admitting: Family Medicine

## 2013-12-04 VITALS — BP 130/90 | Temp 98.4°F | Wt 202.0 lb

## 2013-12-04 DIAGNOSIS — F3289 Other specified depressive episodes: Secondary | ICD-10-CM

## 2013-12-04 DIAGNOSIS — E291 Testicular hypofunction: Secondary | ICD-10-CM

## 2013-12-04 DIAGNOSIS — E039 Hypothyroidism, unspecified: Secondary | ICD-10-CM

## 2013-12-04 DIAGNOSIS — N529 Male erectile dysfunction, unspecified: Secondary | ICD-10-CM

## 2013-12-04 DIAGNOSIS — F329 Major depressive disorder, single episode, unspecified: Secondary | ICD-10-CM

## 2013-12-04 DIAGNOSIS — K219 Gastro-esophageal reflux disease without esophagitis: Secondary | ICD-10-CM

## 2013-12-04 LAB — LIPID PANEL
CHOL/HDL RATIO: 4
Cholesterol: 237 mg/dL — ABNORMAL HIGH (ref 0–200)
HDL: 53.7 mg/dL (ref >39.00)
Triglycerides: 95 mg/dL (ref 0.0–149.0)
VLDL: 19 mg/dL (ref 0.0–40.0)

## 2013-12-04 LAB — BASIC METABOLIC PANEL
BUN: 15 mg/dL (ref 6–23)
CALCIUM: 9 mg/dL (ref 8.4–10.5)
CO2: 29 mEq/L (ref 19–32)
Chloride: 103 mEq/L (ref 96–112)
Creatinine, Ser: 0.9 mg/dL (ref 0.4–1.5)
GFR: 85.44 mL/min (ref >60.00)
GLUCOSE: 96 mg/dL (ref 70–99)
POTASSIUM: 4.7 meq/L (ref 3.5–5.1)
Sodium: 139 mEq/L (ref 135–145)

## 2013-12-04 LAB — POCT URINALYSIS DIPSTICK
BILIRUBIN UA: NEGATIVE
Blood, UA: NEGATIVE
Glucose, UA: NEGATIVE
KETONES UA: NEGATIVE
LEUKOCYTES UA: NEGATIVE
Nitrite, UA: NEGATIVE
PH UA: 5
Protein, UA: NEGATIVE
Urobilinogen, UA: 0.2

## 2013-12-04 LAB — CBC WITH DIFFERENTIAL/PLATELET
BASOS PCT: 0.2 % (ref 0.0–3.0)
Basophils Absolute: 0 10*3/uL (ref 0.0–0.1)
EOS PCT: 1.4 % (ref 0.0–5.0)
Eosinophils Absolute: 0.1 10*3/uL (ref 0.0–0.7)
HEMATOCRIT: 44.5 % (ref 39.0–52.0)
HEMOGLOBIN: 15.3 g/dL (ref 13.0–17.0)
Lymphocytes Relative: 15.7 % (ref 12.0–46.0)
Lymphs Abs: 1.2 10*3/uL (ref 0.7–4.0)
MCHC: 34.4 g/dL (ref 30.0–36.0)
MCV: 94 fl (ref 78.0–100.0)
MONO ABS: 0.4 10*3/uL (ref 0.1–1.0)
Monocytes Relative: 5.9 % (ref 3.0–12.0)
NEUTROS ABS: 5.9 10*3/uL (ref 1.4–7.7)
Neutrophils Relative %: 76.8 % (ref 43.0–77.0)
Platelets: 254 10*3/uL (ref 150.0–400.0)
RBC: 4.74 Mil/uL (ref 4.22–5.81)
RDW: 13.1 % (ref 11.5–14.6)
WBC: 7.7 10*3/uL (ref 4.5–10.5)

## 2013-12-04 LAB — HEPATIC FUNCTION PANEL
ALBUMIN: 3.9 g/dL (ref 3.5–5.2)
ALT: 18 U/L (ref 0–53)
AST: 20 U/L (ref 0–37)
Alkaline Phosphatase: 46 U/L (ref 39–117)
Bilirubin, Direct: 0.1 mg/dL (ref 0.0–0.3)
TOTAL PROTEIN: 6.6 g/dL (ref 6.0–8.3)
Total Bilirubin: 0.6 mg/dL (ref 0.3–1.2)

## 2013-12-04 LAB — PSA: PSA: 0.48 ng/mL (ref 0.10–4.00)

## 2013-12-04 LAB — TSH: TSH: 1.47 u[IU]/mL (ref 0.35–5.50)

## 2013-12-04 LAB — LDL CHOLESTEROL, DIRECT: Direct LDL: 161.2 mg/dL

## 2013-12-04 MED ORDER — OMEPRAZOLE 20 MG PO CPDR: 20.0000 mg | DELAYED_RELEASE_CAPSULE | Freq: Every day | ORAL | Status: DC

## 2013-12-04 MED ORDER — VARDENAFIL HCL 20 MG PO TABS: ORAL_TABLET | ORAL | Status: DC

## 2013-12-04 MED ORDER — CITALOPRAM HYDROBROMIDE 20 MG PO TABS: ORAL_TABLET | ORAL | Status: DC

## 2013-12-04 MED ORDER — LEVOTHYROXINE SODIUM 88 MCG PO TABS: ORAL_TABLET | ORAL | Status: DC

## 2013-12-04 NOTE — Progress Notes (Signed)
Pre visit review using our clinic review tool, if applicable. No additional management support is needed unless otherwise documented below in the visit note. 

## 2013-12-04 NOTE — Progress Notes (Signed)
   Subjective:    Patient ID: Jesse Mcmahon, male    DOB: [birth year], 68 y.o.   MRN: [id number]  HPI Jesse Mcmahon is a 68 year old male who comes in today review his medications  We have not seen him in over a year  He takes Celexa 20 mg daily for mild depression, Synthroid 88 mcg daily, omeprazole 20 mg daily and Levitra 20 mg when necessary  He feels well and has no complaints   Review of Systems Review of systems negative    Objective:   Physical Exam Well-developed well-nourished male no acute distress vital signs stable he is afebrile       Assessment & Plan:  History of mild depression continue Celexa  History of hypothyroidism continue Synthroid  Reflux esophagitis continue omeprazole  Erectile dysfunction continue Levitra

## 2013-12-04 NOTE — Patient Instructions (Signed)
Continue current medications  Labs today  Set up a 30 minute appointment in February March for general physical exam

## 2013-12-30 ENCOUNTER — Encounter: Payer: Self-pay | Admitting: Family Medicine

## 2013-12-30 ENCOUNTER — Ambulatory Visit (INDEPENDENT_AMBULATORY_CARE_PROVIDER_SITE_OTHER): Payer: Medicare Other | Admitting: Family Medicine

## 2013-12-30 VITALS — Temp 99.2°F | Ht 67.25 in | Wt 200.0 lb

## 2013-12-30 DIAGNOSIS — E039 Hypothyroidism, unspecified: Secondary | ICD-10-CM

## 2013-12-30 DIAGNOSIS — K219 Gastro-esophageal reflux disease without esophagitis: Secondary | ICD-10-CM

## 2013-12-30 DIAGNOSIS — F3289 Other specified depressive episodes: Secondary | ICD-10-CM

## 2013-12-30 DIAGNOSIS — E291 Testicular hypofunction: Secondary | ICD-10-CM

## 2013-12-30 DIAGNOSIS — N529 Male erectile dysfunction, unspecified: Secondary | ICD-10-CM

## 2013-12-30 DIAGNOSIS — F329 Major depressive disorder, single episode, unspecified: Secondary | ICD-10-CM

## 2013-12-30 MED ORDER — LEVOTHYROXINE SODIUM 88 MCG PO TABS: ORAL_TABLET | ORAL | Status: DC

## 2013-12-30 MED ORDER — CITALOPRAM HYDROBROMIDE 20 MG PO TABS: ORAL_TABLET | ORAL | Status: DC

## 2013-12-30 MED ORDER — VARDENAFIL HCL 20 MG PO TABS: ORAL_TABLET | ORAL | Status: AC

## 2013-12-30 MED ORDER — OMEPRAZOLE 20 MG PO CPDR: 20.0000 mg | DELAYED_RELEASE_CAPSULE | Freq: Every day | ORAL | Status: DC

## 2013-12-30 NOTE — Patient Instructions (Signed)
Sunscreens SPF 50+......... apply before going out in the sun and every 3 hours  Wear a hat  Limit your alcohol consumption to 2 beers per day  Return in 6 months for a repeat thorough skin exam  Call GI and get set up for the followup colonoscopy

## 2013-12-30 NOTE — Progress Notes (Signed)
   Subjective:    Patient ID: Jesse Mcmahon, male    DOB: [birth year], 68 y.o.   MRN: [id number]  HPI Dmitri is a 68 year old male nonsmoker who comes in today for a Medicare wellness examination because of a history of underlying depression, hypothyroidism, reflux esophagitis, and erectile dysfunction.  Cognitive function normal he walks on a regular basis home health safety reviewed no issues identified, no guns in the house, he does have a health care power of attorney and living well  He gets routine eye care dental care he's neglected to get a followup colonoscopy. He had a polyp on his first colonoscopy and went back in a couple years was clean but then never went back for a followup. Recommend he followup in GI.  Vaccinations up-to-date   Review of Systems  Constitutional: Negative.   HENT: Negative.   Eyes: Negative.   Respiratory: Negative.   Cardiovascular: Negative.   Gastrointestinal: Negative.   Endocrine: Negative.   Genitourinary: Negative.   Musculoskeletal: Negative.   Skin: Negative.   Allergic/Immunologic: Negative.   Neurological: Negative.   Hematological: Negative.   Psychiatric/Behavioral: Negative.        Objective:   Physical Exam  Nursing note and vitals reviewed. Constitutional: He is oriented to person, place, and time. He appears well-developed and well-nourished.  HENT:  Head: Normocephalic and atraumatic.  Right Ear: External ear normal.  Left Ear: External ear normal.  Nose: Nose normal.  Mouth/Throat: Oropharynx is clear and moist.  Eyes: Conjunctivae and EOM are normal. Pupils are equal, round, and reactive to light.  Neck: Normal range of motion. Neck supple. No JVD present. No tracheal deviation present. No thyromegaly present.  Cardiovascular: Normal rate, regular rhythm, normal heart sounds and intact distal pulses.  Exam reveals no gallop and no friction rub.   No murmur heard. No carotid or icterus peripheral pulses 2+ and  symmetrical  Pulmonary/Chest: Effort normal and breath sounds normal. No stridor. No respiratory distress. He has no wheezes. He has no rales. He exhibits no tenderness.  Abdominal: Soft. Bowel sounds are normal. He exhibits no distension and no mass. There is no tenderness. There is no rebound and no guarding.  Genitourinary: Rectum normal, prostate normal and penis normal. Guaiac negative stool. No penile tenderness.  Musculoskeletal: Normal range of motion. He exhibits no edema and no tenderness.  Lymphadenopathy:    He has no cervical adenopathy.  Neurological: He is alert and oriented to person, place, and time. He has normal reflexes. No cranial nerve deficit. He exhibits normal muscle tone.  Skin: Skin is warm and dry. No rash noted. No erythema. No pallor.  Severe sun damage  Psychiatric: He has a normal mood and affect. His behavior is normal. Judgment and thought content normal.          Assessment & Plan:  Healthy male  History of mild depression continue Celexa  History of hypothyroidism continue thyroid  Reflux esophagitis continue Prilosec  Erectile dysfunction continue Levitra  Chronic sun damage recommend sunscreens SPF 50+ a half etc. etc. followup skin exam in 6 months

## 2013-12-30 NOTE — Progress Notes (Signed)
Pre visit review using our clinic review tool, if applicable. No additional management support is needed unless otherwise documented below in the visit note. 

## 2014-03-25 ENCOUNTER — Telehealth: Payer: Self-pay | Admitting: Family Medicine

## 2014-03-25 NOTE — Telephone Encounter (Signed)
Pt states dr Michael Litter him at last visit if he was having trouble emptying bladder. At that time he was not. Now pt is. Pt gets up 3-4 x nite to void. Pt would like med for this. [facility]

## 2014-03-25 NOTE — Telephone Encounter (Signed)
Attempted to call patient but "voice mail has not been set up yet". Per Dr Sherren Mocha patient should go to urology.

## 2014-03-26 MED ORDER — TAMSULOSIN HCL 0.4 MG PO CAPS: 0.4000 mg | ORAL_CAPSULE | Freq: Every day | ORAL | Status: AC

## 2014-03-26 NOTE — Telephone Encounter (Signed)
Patient is out of town he would like to know if he can have a prescription until he comes back?

## 2014-03-26 NOTE — Telephone Encounter (Signed)
Per Dr Sherren Mocha patient should decrease his caffeine intake and can have a prescription for Flomax until he can see an urologist.

## 2014-06-23 ENCOUNTER — Ambulatory Visit: Payer: Medicare Other | Admitting: Family Medicine

## 2015-02-02 ENCOUNTER — Telehealth: Payer: Self-pay | Admitting: Family Medicine

## 2015-02-02 DIAGNOSIS — E038 Other specified hypothyroidism: Secondary | ICD-10-CM

## 2015-02-02 MED ORDER — LEVOTHYROXINE SODIUM 88 MCG PO TABS
ORAL_TABLET | ORAL | Status: DC
Start: 2015-02-02 — End: 2015-04-06

## 2015-02-02 NOTE — Telephone Encounter (Signed)
Rx sent 

## 2015-02-02 NOTE — Telephone Encounter (Signed)
[place]  [phone] is requesting re-fill on levothyroxine (SYNTHROID, LEVOTHROID) 88 MCG tablet

## 2015-02-04 NOTE — Telephone Encounter (Signed)
error 

## 2015-02-11 ENCOUNTER — Telehealth: Payer: Self-pay | Admitting: Family Medicine

## 2015-02-11 DIAGNOSIS — F330 Major depressive disorder, recurrent, mild: Secondary | ICD-10-CM

## 2015-02-11 MED ORDER — CITALOPRAM HYDROBROMIDE 20 MG PO TABS: ORAL_TABLET | ORAL | Status: AC

## 2015-02-11 NOTE — Telephone Encounter (Signed)
[facility] citalopram (CELEXA) 20 MG tablet.

## 2015-04-06 ENCOUNTER — Telehealth: Payer: Self-pay | Admitting: Family Medicine

## 2015-04-06 DIAGNOSIS — K219 Gastro-esophageal reflux disease without esophagitis: Secondary | ICD-10-CM

## 2015-04-06 NOTE — Telephone Encounter (Signed)
[facility],  (fax [phone]) refill request for omeprazole (PRILOSEC) 20 MG capsule

## 2015-04-12 MED ORDER — OMEPRAZOLE 20 MG PO CPDR
20.0000 mg | DELAYED_RELEASE_CAPSULE | Freq: Every day | ORAL | Status: AC
Start: 2015-04-12 — End: ?

## 2015-04-12 NOTE — Telephone Encounter (Signed)
Pt needs omeprazole not levothyroxine

## 2015-04-12 NOTE — Addendum Note (Signed)
Addended by: Westley Hummer B on: 04/12/2015 09:53 AM   Modules accepted: Orders

## 2015-05-19 DIAGNOSIS — N400 Enlarged prostate without lower urinary tract symptoms: Secondary | ICD-10-CM | POA: Diagnosis not present

## 2015-05-19 DIAGNOSIS — E039 Hypothyroidism, unspecified: Secondary | ICD-10-CM | POA: Diagnosis not present

## 2015-05-19 DIAGNOSIS — K219 Gastro-esophageal reflux disease without esophagitis: Secondary | ICD-10-CM | POA: Diagnosis not present

## 2015-05-19 DIAGNOSIS — F411 Generalized anxiety disorder: Secondary | ICD-10-CM | POA: Diagnosis not present

## 2015-05-25 DIAGNOSIS — N400 Enlarged prostate without lower urinary tract symptoms: Secondary | ICD-10-CM | POA: Diagnosis not present

## 2015-05-25 DIAGNOSIS — E785 Hyperlipidemia, unspecified: Secondary | ICD-10-CM | POA: Diagnosis not present

## 2015-05-25 DIAGNOSIS — E039 Hypothyroidism, unspecified: Secondary | ICD-10-CM | POA: Diagnosis not present

## 2015-05-25 DIAGNOSIS — F411 Generalized anxiety disorder: Secondary | ICD-10-CM | POA: Diagnosis not present

## 2015-06-03 DIAGNOSIS — E785 Hyperlipidemia, unspecified: Secondary | ICD-10-CM | POA: Diagnosis not present

## 2015-06-03 DIAGNOSIS — E039 Hypothyroidism, unspecified: Secondary | ICD-10-CM | POA: Diagnosis not present

## 2015-06-03 DIAGNOSIS — K219 Gastro-esophageal reflux disease without esophagitis: Secondary | ICD-10-CM | POA: Diagnosis not present

## 2015-06-29 DIAGNOSIS — L814 Other melanin hyperpigmentation: Secondary | ICD-10-CM | POA: Diagnosis not present

## 2015-06-29 DIAGNOSIS — D485 Neoplasm of uncertain behavior of skin: Secondary | ICD-10-CM | POA: Diagnosis not present

## 2015-06-29 DIAGNOSIS — C44319 Basal cell carcinoma of skin of other parts of face: Secondary | ICD-10-CM | POA: Diagnosis not present

## 2015-06-29 DIAGNOSIS — L570 Actinic keratosis: Secondary | ICD-10-CM | POA: Diagnosis not present

## 2015-07-23 DIAGNOSIS — C44319 Basal cell carcinoma of skin of other parts of face: Secondary | ICD-10-CM | POA: Diagnosis not present

## 2015-09-24 DIAGNOSIS — E039 Hypothyroidism, unspecified: Secondary | ICD-10-CM | POA: Diagnosis not present

## 2015-09-24 DIAGNOSIS — E785 Hyperlipidemia, unspecified: Secondary | ICD-10-CM | POA: Diagnosis not present

## 2015-09-24 DIAGNOSIS — K219 Gastro-esophageal reflux disease without esophagitis: Secondary | ICD-10-CM | POA: Diagnosis not present

## 2015-10-04 DIAGNOSIS — Z789 Other specified health status: Secondary | ICD-10-CM | POA: Diagnosis not present

## 2015-10-04 DIAGNOSIS — E785 Hyperlipidemia, unspecified: Secondary | ICD-10-CM | POA: Diagnosis not present

## 2015-10-04 DIAGNOSIS — N401 Enlarged prostate with lower urinary tract symptoms: Secondary | ICD-10-CM | POA: Diagnosis not present

## 2015-10-04 DIAGNOSIS — E039 Hypothyroidism, unspecified: Secondary | ICD-10-CM | POA: Diagnosis not present

## 2015-10-04 DIAGNOSIS — Z23 Encounter for immunization: Secondary | ICD-10-CM | POA: Diagnosis not present

## 2016-03-06 DIAGNOSIS — L570 Actinic keratosis: Secondary | ICD-10-CM | POA: Diagnosis not present

## 2016-03-06 DIAGNOSIS — L905 Scar conditions and fibrosis of skin: Secondary | ICD-10-CM | POA: Diagnosis not present

## 2016-04-03 DIAGNOSIS — E039 Hypothyroidism, unspecified: Secondary | ICD-10-CM | POA: Diagnosis not present

## 2016-04-03 DIAGNOSIS — E785 Hyperlipidemia, unspecified: Secondary | ICD-10-CM | POA: Diagnosis not present

## 2016-04-03 DIAGNOSIS — Z789 Other specified health status: Secondary | ICD-10-CM | POA: Diagnosis not present

## 2016-04-03 DIAGNOSIS — Z125 Encounter for screening for malignant neoplasm of prostate: Secondary | ICD-10-CM | POA: Diagnosis not present

## 2016-04-10 DIAGNOSIS — K219 Gastro-esophageal reflux disease without esophagitis: Secondary | ICD-10-CM | POA: Diagnosis not present

## 2016-04-10 DIAGNOSIS — E039 Hypothyroidism, unspecified: Secondary | ICD-10-CM | POA: Diagnosis not present

## 2016-04-10 DIAGNOSIS — E785 Hyperlipidemia, unspecified: Secondary | ICD-10-CM | POA: Diagnosis not present

## 2016-04-10 DIAGNOSIS — R7309 Other abnormal glucose: Secondary | ICD-10-CM | POA: Diagnosis not present

## 2016-04-12 ENCOUNTER — Telehealth: Payer: Self-pay | Admitting: Family Medicine

## 2016-04-12 NOTE — Telephone Encounter (Signed)
error 

## 2016-10-02 DIAGNOSIS — E039 Hypothyroidism, unspecified: Secondary | ICD-10-CM | POA: Diagnosis not present

## 2016-10-02 DIAGNOSIS — R7309 Other abnormal glucose: Secondary | ICD-10-CM | POA: Diagnosis not present

## 2016-10-02 DIAGNOSIS — K219 Gastro-esophageal reflux disease without esophagitis: Secondary | ICD-10-CM | POA: Diagnosis not present

## 2016-10-02 DIAGNOSIS — E785 Hyperlipidemia, unspecified: Secondary | ICD-10-CM | POA: Diagnosis not present

## 2016-10-12 DIAGNOSIS — R7309 Other abnormal glucose: Secondary | ICD-10-CM | POA: Diagnosis not present

## 2016-10-12 DIAGNOSIS — E785 Hyperlipidemia, unspecified: Secondary | ICD-10-CM | POA: Diagnosis not present

## 2016-10-12 DIAGNOSIS — Z23 Encounter for immunization: Secondary | ICD-10-CM | POA: Diagnosis not present

## 2016-10-12 DIAGNOSIS — E039 Hypothyroidism, unspecified: Secondary | ICD-10-CM | POA: Diagnosis not present

## 2016-10-17 ENCOUNTER — Telehealth: Payer: Self-pay

## 2016-10-17 NOTE — Telephone Encounter (Signed)
Call to MR. Jesse Mcmahon at work number and did get a VM which identified him by name  I left a VM to confirm if he is still a patient the Dr. Sherren Mocha At [facility] as he has not been seen since 2015;. Need to schedule an apt with a provider for AWV; CPE;

## 2017-04-04 DIAGNOSIS — E039 Hypothyroidism, unspecified: Secondary | ICD-10-CM | POA: Diagnosis not present

## 2017-04-04 DIAGNOSIS — E785 Hyperlipidemia, unspecified: Secondary | ICD-10-CM | POA: Diagnosis not present

## 2017-04-04 DIAGNOSIS — R7309 Other abnormal glucose: Secondary | ICD-10-CM | POA: Diagnosis not present

## 2017-04-12 DIAGNOSIS — R030 Elevated blood-pressure reading, without diagnosis of hypertension: Secondary | ICD-10-CM | POA: Diagnosis not present

## 2017-04-12 DIAGNOSIS — N401 Enlarged prostate with lower urinary tract symptoms: Secondary | ICD-10-CM | POA: Diagnosis not present

## 2017-04-12 DIAGNOSIS — E039 Hypothyroidism, unspecified: Secondary | ICD-10-CM | POA: Diagnosis not present

## 2017-04-12 DIAGNOSIS — E785 Hyperlipidemia, unspecified: Secondary | ICD-10-CM | POA: Diagnosis not present

## 2017-06-04 DIAGNOSIS — L570 Actinic keratosis: Secondary | ICD-10-CM | POA: Diagnosis not present

## 2017-06-04 DIAGNOSIS — L905 Scar conditions and fibrosis of skin: Secondary | ICD-10-CM | POA: Diagnosis not present

## 2017-10-08 DIAGNOSIS — N400 Enlarged prostate without lower urinary tract symptoms: Secondary | ICD-10-CM | POA: Diagnosis not present

## 2017-10-08 DIAGNOSIS — E785 Hyperlipidemia, unspecified: Secondary | ICD-10-CM | POA: Diagnosis not present

## 2017-10-08 DIAGNOSIS — R030 Elevated blood-pressure reading, without diagnosis of hypertension: Secondary | ICD-10-CM | POA: Diagnosis not present

## 2017-10-08 DIAGNOSIS — E039 Hypothyroidism, unspecified: Secondary | ICD-10-CM | POA: Diagnosis not present

## 2017-10-15 DIAGNOSIS — K219 Gastro-esophageal reflux disease without esophagitis: Secondary | ICD-10-CM | POA: Diagnosis not present

## 2017-10-15 DIAGNOSIS — N401 Enlarged prostate with lower urinary tract symptoms: Secondary | ICD-10-CM | POA: Diagnosis not present

## 2017-10-15 DIAGNOSIS — E039 Hypothyroidism, unspecified: Secondary | ICD-10-CM | POA: Diagnosis not present

## 2017-10-15 DIAGNOSIS — E785 Hyperlipidemia, unspecified: Secondary | ICD-10-CM | POA: Diagnosis not present

## 2017-10-15 DIAGNOSIS — Z23 Encounter for immunization: Secondary | ICD-10-CM | POA: Diagnosis not present

## 2017-10-16 DIAGNOSIS — D485 Neoplasm of uncertain behavior of skin: Secondary | ICD-10-CM | POA: Diagnosis not present

## 2017-10-16 DIAGNOSIS — Z85828 Personal history of other malignant neoplasm of skin: Secondary | ICD-10-CM | POA: Diagnosis not present

## 2017-10-16 DIAGNOSIS — Z08 Encounter for follow-up examination after completed treatment for malignant neoplasm: Secondary | ICD-10-CM | POA: Diagnosis not present

## 2017-10-16 DIAGNOSIS — L570 Actinic keratosis: Secondary | ICD-10-CM | POA: Diagnosis not present

## 2017-10-16 DIAGNOSIS — D0362 Melanoma in situ of left upper limb, including shoulder: Secondary | ICD-10-CM | POA: Diagnosis not present

## 2017-10-16 DIAGNOSIS — D225 Melanocytic nevi of trunk: Secondary | ICD-10-CM | POA: Diagnosis not present

## 2017-10-16 DIAGNOSIS — L814 Other melanin hyperpigmentation: Secondary | ICD-10-CM | POA: Diagnosis not present

## 2017-11-09 DIAGNOSIS — R39198 Other difficulties with micturition: Secondary | ICD-10-CM | POA: Diagnosis not present

## 2017-11-09 DIAGNOSIS — N529 Male erectile dysfunction, unspecified: Secondary | ICD-10-CM | POA: Diagnosis not present

## 2017-11-30 DIAGNOSIS — L905 Scar conditions and fibrosis of skin: Secondary | ICD-10-CM | POA: Diagnosis not present

## 2017-11-30 DIAGNOSIS — C4362 Malignant melanoma of left upper limb, including shoulder: Secondary | ICD-10-CM | POA: Diagnosis not present

## 2017-12-10 DIAGNOSIS — N401 Enlarged prostate with lower urinary tract symptoms: Secondary | ICD-10-CM | POA: Diagnosis not present

## 2017-12-10 DIAGNOSIS — N529 Male erectile dysfunction, unspecified: Secondary | ICD-10-CM | POA: Diagnosis not present

## 2018-04-10 DIAGNOSIS — K219 Gastro-esophageal reflux disease without esophagitis: Secondary | ICD-10-CM | POA: Diagnosis not present

## 2018-04-10 DIAGNOSIS — R030 Elevated blood-pressure reading, without diagnosis of hypertension: Secondary | ICD-10-CM | POA: Diagnosis not present

## 2018-04-10 DIAGNOSIS — E039 Hypothyroidism, unspecified: Secondary | ICD-10-CM | POA: Diagnosis not present

## 2018-04-10 DIAGNOSIS — E785 Hyperlipidemia, unspecified: Secondary | ICD-10-CM | POA: Diagnosis not present

## 2018-04-10 DIAGNOSIS — N400 Enlarged prostate without lower urinary tract symptoms: Secondary | ICD-10-CM | POA: Diagnosis not present

## 2018-04-15 DIAGNOSIS — C4362 Malignant melanoma of left upper limb, including shoulder: Secondary | ICD-10-CM | POA: Diagnosis not present

## 2018-04-15 DIAGNOSIS — E039 Hypothyroidism, unspecified: Secondary | ICD-10-CM | POA: Diagnosis not present

## 2018-04-15 DIAGNOSIS — E785 Hyperlipidemia, unspecified: Secondary | ICD-10-CM | POA: Diagnosis not present

## 2018-07-18 DIAGNOSIS — E039 Hypothyroidism, unspecified: Secondary | ICD-10-CM | POA: Diagnosis not present

## 2018-07-18 DIAGNOSIS — E785 Hyperlipidemia, unspecified: Secondary | ICD-10-CM | POA: Diagnosis not present

## 2018-07-22 DIAGNOSIS — E785 Hyperlipidemia, unspecified: Secondary | ICD-10-CM | POA: Diagnosis not present

## 2018-07-22 DIAGNOSIS — E039 Hypothyroidism, unspecified: Secondary | ICD-10-CM | POA: Diagnosis not present

## 2018-07-22 DIAGNOSIS — Z1211 Encounter for screening for malignant neoplasm of colon: Secondary | ICD-10-CM | POA: Diagnosis not present

## 2018-09-11 DIAGNOSIS — L814 Other melanin hyperpigmentation: Secondary | ICD-10-CM | POA: Diagnosis not present

## 2018-09-11 DIAGNOSIS — Z85820 Personal history of malignant melanoma of skin: Secondary | ICD-10-CM | POA: Diagnosis not present

## 2018-09-11 DIAGNOSIS — Z85828 Personal history of other malignant neoplasm of skin: Secondary | ICD-10-CM | POA: Diagnosis not present

## 2018-09-11 DIAGNOSIS — L570 Actinic keratosis: Secondary | ICD-10-CM | POA: Diagnosis not present

## 2018-09-11 DIAGNOSIS — C44529 Squamous cell carcinoma of skin of other part of trunk: Secondary | ICD-10-CM | POA: Diagnosis not present

## 2018-09-11 DIAGNOSIS — Z08 Encounter for follow-up examination after completed treatment for malignant neoplasm: Secondary | ICD-10-CM | POA: Diagnosis not present

## 2018-09-11 DIAGNOSIS — D225 Melanocytic nevi of trunk: Secondary | ICD-10-CM | POA: Diagnosis not present

## 2018-09-11 DIAGNOSIS — D485 Neoplasm of uncertain behavior of skin: Secondary | ICD-10-CM | POA: Diagnosis not present

## 2018-10-14 DIAGNOSIS — Z23 Encounter for immunization: Secondary | ICD-10-CM | POA: Diagnosis not present

## 2018-10-22 DIAGNOSIS — C44529 Squamous cell carcinoma of skin of other part of trunk: Secondary | ICD-10-CM | POA: Diagnosis not present

## 2018-10-22 DIAGNOSIS — L905 Scar conditions and fibrosis of skin: Secondary | ICD-10-CM | POA: Diagnosis not present

## 2019-01-17 DIAGNOSIS — E785 Hyperlipidemia, unspecified: Secondary | ICD-10-CM | POA: Diagnosis not present

## 2019-01-17 DIAGNOSIS — E039 Hypothyroidism, unspecified: Secondary | ICD-10-CM | POA: Diagnosis not present

## 2019-01-22 DIAGNOSIS — F411 Generalized anxiety disorder: Secondary | ICD-10-CM | POA: Diagnosis not present

## 2019-01-22 DIAGNOSIS — E785 Hyperlipidemia, unspecified: Secondary | ICD-10-CM | POA: Diagnosis not present

## 2019-01-22 DIAGNOSIS — E039 Hypothyroidism, unspecified: Secondary | ICD-10-CM | POA: Diagnosis not present

## 2019-01-30 DIAGNOSIS — Z08 Encounter for follow-up examination after completed treatment for malignant neoplasm: Secondary | ICD-10-CM | POA: Diagnosis not present

## 2019-01-30 DIAGNOSIS — Z85828 Personal history of other malignant neoplasm of skin: Secondary | ICD-10-CM | POA: Diagnosis not present

## 2019-01-30 DIAGNOSIS — L910 Hypertrophic scar: Secondary | ICD-10-CM | POA: Diagnosis not present

## 2019-01-30 DIAGNOSIS — L570 Actinic keratosis: Secondary | ICD-10-CM | POA: Diagnosis not present

## 2019-01-30 DIAGNOSIS — C44329 Squamous cell carcinoma of skin of other parts of face: Secondary | ICD-10-CM | POA: Diagnosis not present

## 2019-01-30 DIAGNOSIS — D485 Neoplasm of uncertain behavior of skin: Secondary | ICD-10-CM | POA: Diagnosis not present

## 2019-07-24 DIAGNOSIS — E785 Hyperlipidemia, unspecified: Secondary | ICD-10-CM | POA: Diagnosis not present

## 2019-07-24 DIAGNOSIS — F411 Generalized anxiety disorder: Secondary | ICD-10-CM | POA: Diagnosis not present

## 2019-07-24 DIAGNOSIS — E039 Hypothyroidism, unspecified: Secondary | ICD-10-CM | POA: Diagnosis not present

## 2019-07-30 DIAGNOSIS — E785 Hyperlipidemia, unspecified: Secondary | ICD-10-CM | POA: Diagnosis not present

## 2019-07-30 DIAGNOSIS — E039 Hypothyroidism, unspecified: Secondary | ICD-10-CM | POA: Diagnosis not present

## 2019-07-30 DIAGNOSIS — R7309 Other abnormal glucose: Secondary | ICD-10-CM | POA: Diagnosis not present

## 2019-09-25 DIAGNOSIS — Z23 Encounter for immunization: Secondary | ICD-10-CM | POA: Diagnosis not present

## 2020-05-12 DIAGNOSIS — E785 Hyperlipidemia, unspecified: Secondary | ICD-10-CM | POA: Diagnosis not present

## 2020-05-12 DIAGNOSIS — R7309 Other abnormal glucose: Secondary | ICD-10-CM | POA: Diagnosis not present

## 2020-05-12 DIAGNOSIS — E039 Hypothyroidism, unspecified: Secondary | ICD-10-CM | POA: Diagnosis not present

## 2020-05-20 DIAGNOSIS — E785 Hyperlipidemia, unspecified: Secondary | ICD-10-CM | POA: Diagnosis not present

## 2020-05-20 DIAGNOSIS — E039 Hypothyroidism, unspecified: Secondary | ICD-10-CM | POA: Diagnosis not present

## 2020-05-20 DIAGNOSIS — R7309 Other abnormal glucose: Secondary | ICD-10-CM | POA: Diagnosis not present
